# Patient Record
Sex: Female | Born: 1970 | Race: White | Hispanic: No | Marital: Married
Health system: Midwestern US, Academic
[De-identification: ages and names within clinical notes are randomized; demographics above are authoritative.]

---

## 2017-09-21 ENCOUNTER — Encounter: Admit: 2017-09-21 | Discharge: 2017-09-21 | Payer: BC Managed Care – PPO

## 2017-10-07 ENCOUNTER — Encounter: Admit: 2017-10-07 | Discharge: 2017-10-07 | Payer: BC Managed Care – PPO

## 2017-10-07 ENCOUNTER — Ambulatory Visit: Admit: 2017-10-07 | Discharge: 2017-10-08 | Payer: BC Managed Care – PPO

## 2017-10-07 DIAGNOSIS — G4733 Obstructive sleep apnea (adult) (pediatric): Principal | ICD-10-CM

## 2017-10-07 DIAGNOSIS — I471 Supraventricular tachycardia: ICD-10-CM

## 2017-10-07 DIAGNOSIS — Z0389 Encounter for observation for other suspected diseases and conditions ruled out: Principal | ICD-10-CM

## 2017-10-07 DIAGNOSIS — G909 Disorder of the autonomic nervous system, unspecified: ICD-10-CM

## 2017-10-07 DIAGNOSIS — I4891 Unspecified atrial fibrillation: ICD-10-CM

## 2017-10-07 DIAGNOSIS — I4892 Unspecified atrial flutter: ICD-10-CM

## 2017-10-07 MED ORDER — FLECAINIDE 50 MG PO TAB
ORAL_TABLET | Freq: Two times a day (BID) | ORAL | 11 refills | 30.00000 days | Status: AC
Start: 2017-10-07 — End: 2017-10-21

## 2017-10-21 ENCOUNTER — Encounter: Admit: 2017-10-21 | Discharge: 2017-10-21 | Payer: BC Managed Care – PPO

## 2017-10-21 MED ORDER — FLECAINIDE 50 MG PO TAB
50 mg | ORAL_TABLET | Freq: Two times a day (BID) | ORAL | 3 refills | 30.00000 days | Status: AC
Start: 2017-10-21 — End: 2017-11-04

## 2017-11-04 ENCOUNTER — Encounter: Admit: 2017-11-04 | Discharge: 2017-11-04 | Payer: BC Managed Care – PPO

## 2017-11-04 MED ORDER — FLECAINIDE 50 MG PO TAB
50 mg | ORAL_TABLET | Freq: Two times a day (BID) | ORAL | 3 refills | 30.00000 days | Status: AC
Start: 2017-11-04 — End: 2018-10-12

## 2018-03-25 LAB — COMPREHENSIVE METABOLIC PANEL
Lab: 13
Lab: 24
Lab: 27
Lab: 7.7
Lab: 80
Lab: 9.6
Lab: 91

## 2018-03-25 LAB — THYROID STIMULATING HORMONE-TSH: Lab: 1.8

## 2018-10-12 ENCOUNTER — Encounter: Admit: 2018-10-12 | Discharge: 2018-10-12 | Payer: BC Managed Care – PPO

## 2018-10-12 MED ORDER — FLECAINIDE 50 MG PO TAB
ORAL_TABLET | Freq: Two times a day (BID) | ORAL | 0 refills | 30.00000 days | Status: AC
Start: 2018-10-12 — End: 2018-11-25

## 2018-11-17 ENCOUNTER — Encounter: Admit: 2018-11-17 | Discharge: 2018-11-17 | Payer: BC Managed Care – PPO

## 2018-11-22 ENCOUNTER — Encounter: Admit: 2018-11-22 | Discharge: 2018-11-22 | Payer: BC Managed Care – PPO

## 2018-11-25 ENCOUNTER — Ambulatory Visit: Admit: 2018-11-25 | Discharge: 2018-11-26 | Payer: BC Managed Care – PPO

## 2018-11-25 ENCOUNTER — Encounter: Admit: 2018-11-25 | Discharge: 2018-11-25 | Payer: BC Managed Care – PPO

## 2018-11-25 DIAGNOSIS — I4891 Unspecified atrial fibrillation: ICD-10-CM

## 2018-11-25 DIAGNOSIS — Z0389 Encounter for observation for other suspected diseases and conditions ruled out: Principal | ICD-10-CM

## 2018-11-25 DIAGNOSIS — I471 Supraventricular tachycardia: ICD-10-CM

## 2018-11-25 DIAGNOSIS — I4892 Unspecified atrial flutter: ICD-10-CM

## 2018-11-25 MED ORDER — FERROUS SULFATE 325 MG (65 MG IRON) PO TBEC
325 mg | ORAL_TABLET | Freq: Every day | ORAL | 3 refills | Status: AC
Start: 2018-11-25 — End: ?

## 2018-11-25 MED ORDER — FLECAINIDE 50 MG PO TAB
50 mg | ORAL_TABLET | Freq: Two times a day (BID) | ORAL | 3 refills | 30.00000 days | Status: DC
Start: 2018-11-25 — End: 2019-11-02

## 2018-11-25 NOTE — Patient Instructions
1.  Start aspirin (enteric-coated) 81 mg/day  2.  Flecainide and iron refills sent  3.  We'll plan to see you back in about one year.

## 2018-11-26 DIAGNOSIS — I471 Supraventricular tachycardia: ICD-10-CM

## 2018-11-26 DIAGNOSIS — I4891 Unspecified atrial fibrillation: Principal | ICD-10-CM

## 2018-11-28 ENCOUNTER — Encounter: Admit: 2018-11-28 | Discharge: 2018-11-28 | Payer: BC Managed Care – PPO

## 2018-12-14 ENCOUNTER — Encounter: Admit: 2018-12-14 | Discharge: 2018-12-14 | Payer: BC Managed Care – PPO

## 2019-01-12 ENCOUNTER — Encounter: Admit: 2019-01-12 | Discharge: 2019-01-12

## 2019-01-12 DIAGNOSIS — I480 Paroxysmal atrial fibrillation: Secondary | ICD-10-CM

## 2019-01-12 DIAGNOSIS — E785 Hyperlipidemia, unspecified: Secondary | ICD-10-CM

## 2019-01-18 ENCOUNTER — Encounter: Admit: 2019-01-18 | Discharge: 2019-01-18

## 2019-01-18 DIAGNOSIS — E785 Hyperlipidemia, unspecified: Secondary | ICD-10-CM

## 2019-01-18 DIAGNOSIS — I480 Paroxysmal atrial fibrillation: Secondary | ICD-10-CM

## 2019-01-18 LAB — COMPREHENSIVE METABOLIC PANEL
Lab: 1
Lab: 141
Lab: 20
Lab: 23
Lab: 4
Lab: 63
Lab: 7.4

## 2019-01-18 LAB — LIPID PROFILE
Lab: 174
Lab: 111
Lab: 22
Lab: 3
Lab: 58

## 2019-03-21 ENCOUNTER — Encounter: Admit: 2019-03-21 | Discharge: 2019-03-21 | Payer: BC Managed Care – PPO

## 2019-03-21 NOTE — Progress Notes
Records request sent via fax to [place]. Payton Emerald, RN    Future Appointments   Date Time Provider [place]   04/24/2019  2:30 PM Terance Hart, MD [facility] OB/[facility]

## 2019-04-04 ENCOUNTER — Encounter: Admit: 2019-04-04 | Discharge: 2019-04-04

## 2019-04-04 NOTE — Progress Notes
Records received including appt notes, EMB results, [place], labs, and pap smear. Records placed in ready to scan file. Payton Emerald, RN    Future Appointments   Date Time Provider [place]   04/24/2019  2:30 PM Terance Hart, MD [facility] OB/GYN

## 2019-04-24 ENCOUNTER — Encounter: Admit: 2019-04-24 | Discharge: 2019-04-24 | Payer: BC Managed Care – PPO

## 2019-04-24 ENCOUNTER — Ambulatory Visit: Admit: 2019-04-24 | Discharge: 2019-04-24 | Payer: BC Managed Care – PPO

## 2019-04-24 NOTE — Patient Instructions
General Instructions  To Contact Me:   Please send a MyChart message to Ob/Gyn or call [phone] to reach your doctor?s nurse team.  To Have Medication Refilled:   Please use the MyChart Refill request or contact your pharmacy directly to request medication refills. Please allow 72 hours.  To Receive Your Test Results:   You will receive your test results in person at your appointment, or by MyChart if you are signed up. We prefer to discuss test results during your appointment time, so please try to have your labs done several days before your next routine visit. If unable to discuss in person, your results will either be sent to you via MyChart, by telephone, or by letter. If you are expecting results and have not heard from my office within 2 weeks of your testing, please send a MyChart message or call my office.  Our Lab (Location, Hours, and Fasting Information)  Lab Location: the 1st floor of the Medical Office Building, directly to the left of the Information Desk.  Lab Hours: Monday 6:30 am-6 pm, Tuesday-Friday 7 am-6 pm, and Saturday 7 am-noon.   Fasting for Lab: For fasting labs, please:   Do not eat for at least 8-10 hours before having your labs drawn, drink plenty of water, and make sure to continue your medications as prescribed (unless otherwise specified).  Radiology is on the 2nd floor of the Medical Office Building.  To Schedule an Appointment:   Contact our schedulers at [phone] and select 1. If it has been over a year since your last appointment, please ask for an annual or yearly physical appointment.   Receive Appointment Reminders on Your Cell Phone:   Make sure we have your cell phone number, and text [place] to [phone].  For Urgent Questions:   On nights, weekends or holidays, call the operator at [phone], and ask for the Ob/Gyn on call.   For emergencies, call 911.    We value your feedback and you may receive a survey about your experience with our office. If you had a favorable experience, the only positive survey results that we will receive are if we are rated in the 9 or 10 range out of 10 points.

## 2019-04-25 LAB — ESTRADIOL (E2): Lab: <15 pg/mL

## 2019-04-25 LAB — FOLLICLE STIMULATING HORMONE: Lab: 100 mU/mL

## 2019-04-25 LAB — TSH WITH FREE T4 REFLEX: Lab: 1.3 uU/mL (ref 0.35–5.00)

## 2019-04-25 LAB — BETA-HCG: Lab: 6 U/L — ABNORMAL HIGH (ref <5)

## 2019-05-12 ENCOUNTER — Encounter: Admit: 2019-05-12 | Discharge: 2019-05-12 | Payer: BC Managed Care – PPO

## 2019-11-01 ENCOUNTER — Encounter: Admit: 2019-11-01 | Discharge: 2019-11-01 | Payer: BC Managed Care – PPO

## 2019-11-01 MED ORDER — FLECAINIDE 50 MG PO TAB
ORAL_TABLET | Freq: Two times a day (BID) | ORAL | 3 refills | 30.00000 days | Status: DC
Start: 2019-11-01 — End: 2019-11-20

## 2019-11-20 MED ORDER — FLECAINIDE 50 MG PO TAB
50 mg | ORAL_TABLET | Freq: Two times a day (BID) | ORAL | 3 refills | 30.00000 days | Status: AC
Start: 2019-11-20 — End: ?

## 2019-11-20 NOTE — Progress Notes
Date of Service: 11/20/2019    Isabel Cervantes is a 49 y.o. female.       HPI     Isabel Cervantes was in the [facility] clinic today for follow-up regarding her history of supraventricular arrhythmia and atrial fibrillation.  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  Of course the pandemic has totally messed up those plans and she is still in the process of trying to get things set up.  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    She is under a lot of stress right now because her 60 year old daughter was recently diagnosed with colon cancer and is probably looking at the need for a surgical procedure.    From a cardiac standpoint [name] seems to be doing well.  She is not having any trouble with palpitations nor is she having any breathlessness or anginal chest discomfort.  She had an illness a couple of months ago that was characterized by some right-sided pleurisy.  She had a couple of EKGs done at the [place] clinic and there was some question about one of them being mildly abnormal but all of her symptoms resolved spontaneously and she has had no recurrence.         Vitals:    11/20/19 1119   BP: 122/82   BP Source: Arm, Left Upper   Patient Position: Sitting   Pulse: 81   SpO2: 98%   Weight: 98.1 kg (216 lb 4.8 oz)   Height: 1.626 m (5' 4)   PainSc: Zero     Body mass index is 37.13 kg/m?Marland Kitchen     Past Medical History  Patient Active Problem List    Diagnosis Date Noted   ? OSA (obstructive sleep apnea) 09/17/2015     2017 - Mildly abnormal sleep study--didn't quite qualify for device.  Considering repeat study.  2018 - CPAP started with excellent clinical benefit     ? Dyspnea 03/03/2011   ? Autonomic dysfunction 05/14/2010   ? Chest pain at rest 08/29/2009   ? Light-headedness 01/17/2009   ? Observation for suspected cardiovascular disease 01/16/2009     Cardiac Surveillance            1998 - Structurally normal heart by stress echo.            12/01 - ECOD: negative for ischemia, normal LV size and function.  PAP 36.      ? Atrial fibrillation (HCC) 01/16/2009     Atrial Fibrillation             2001 - Hospitalized at [facility]. Diltiazem initiated. Flecainide as outpatient since then at 50 mg PO BID.  09/03/16 Monitor: Normal 30-day event monitor.  The patient's symptoms do not correlate with significant dysrhythmia.     ? SVT (supraventricular tachycardia) (HCC) 01/16/2009     HIstory of SVT with RFA--she has infrequent recurrences of limited duration (<1 minute)  Atrial tachycardia and flutter             1998 - Radiofrequency ablation by LDB             8/98 - Right atrial/crista tachycardia identified and ablated with significant improvement                       in palpitations.  8/98 - Moderate generalized atrial abnormalities. On Isuprel, she had nonsustained,                       atypical atrial flutter.        ? 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         Review of Systems   Constitution: Negative.   HENT: Negative.    Eyes: Negative.    Cardiovascular: Positive for palpitations.   Respiratory: Negative.    Endocrine: Negative.    Hematologic/Lymphatic: Negative.    Skin: Negative.    Gastrointestinal: Negative.    Genitourinary: Negative.    Neurological: Negative.    Psychiatric/Behavioral: Negative.    Allergic/Immunologic: Negative.        Physical Exam    Physical Exam   General Appearance: no distress   Skin: warm, no ulcers or xanthomas   Digits and Nails: no cyanosis or clubbing   Eyes: conjunctivae and lids normal, pupils are equal and round   Teeth/Gums/Palate: dentition unremarkable, no lesions   Lips & Oral Mucosa: no pallor or cyanosis   Neck Veins: normal JVP , neck veins are not distended   Thyroid: no nodules, masses, tenderness or enlargement   Chest Inspection: chest is normal in appearance   Respiratory Effort: breathing comfortably, no respiratory distress Auscultation/Percussion: lungs clear to auscultation, no rales or rhonchi, no wheezing   PMI: PMI not enlarged or displaced   Cardiac Rhythm: regular rhythm and normal rate   Cardiac Auscultation: S1, S2 normal, no rub, no gallop   Murmurs: no murmur   Peripheral Circulation: normal peripheral circulation   Carotid Arteries: normal carotid upstroke bilaterally, no bruits   Radial Arteries: normal symmetric radial pulses   Abdominal Aorta: no abdominal aortic bruit   Pedal Pulses: normal symmetric pedal pulses   Lower Extremity Edema: no lower extremity edema   Abdominal Exam: soft, non-tender, no masses, bowel sounds normal   Liver & Spleen: no organomegaly   Gait & Station: walks without assistance   Muscle Strength: normal muscle tone   Orientation: oriented to time, place and person   Affect & Mood: appropriate and sustained affect   Language and Memory: patient responsive and seems to comprehend information   Neurologic Exam: neurological assessment grossly intact   Other: moves all extremities      Problems Addressed Today  Encounter Diagnoses   Name Primary?   ? SVT (supraventricular tachycardia) (HCC)    ? Autonomic dysfunction    ? Observation for suspected cardiovascular disease    ? OSA (obstructive sleep apnea)        Assessment and Plan       SVT (supraventricular tachycardia) (HCC)  I renewed her prescription for flecainide today.  It is doing a nice job of suppressing her atrial arrhythmias.    Autonomic dysfunction  She has had no problems with lightheadedness or syncope lately.    Observation for suspected cardiovascular disease  She needs to schedule a follow-up appointment with Dr. Alona Bene.  I did not order any routine lab work today.    OSA (obstructive sleep apnea)  She had a question about the Inspra device.  Additionally, she is having a lot of dry mouth on her CPAP machine.  I asked her to give Dr. Christena Deem office a call.      Current Medications (including today's revisions)  ? cetirizine (ZYRTEC) 10 mg tablet Take 5 mg by mouth every morning.   ? ferrous sulfate 325 mg (  65 mg iron) tablet Take one tablet by mouth daily. (Patient taking differently: Take 325 mg by mouth as Needed.)   ? flecainide (TAMBOCOR) 50 mg tablet Take one tablet by mouth twice daily.   ? L.acid/L.casei/B.bif/B.lon/FOS (PROBIOTIC BLEND PO) Take 1 tablet by mouth daily.   ? MULTI-VITAMIN PO Take  by mouth daily.     Total time spent on today's office visit was 30 minutes.  This includes face-to-face in person visit with patient as well as nonface-to-face time including review of the EMR, outside records, labs, radiologic studies, echocardiogram & other cardiovascular studies, formation of treatment plan, after visit summary, future disposition, and lastly on documentation.

## 2019-11-20 NOTE — Assessment & Plan Note
I renewed her prescription for flecainide today.  It is doing a nice job of suppressing her atrial arrhythmias.

## 2019-11-20 NOTE — Assessment & Plan Note
She had a question about the Inspra device.  Additionally, she is having a lot of dry mouth on her CPAP machine.  I asked her to give Dr. Christena Deem office a call.

## 2019-11-20 NOTE — Telephone Encounter
Dr. Barry Dienes saw patient for OV today and note states, She had a couple of EKGs done at the [facility] clinic and there was some question about one of them being mildly abnormal but all of her symptoms resolved spontaneously and she has had no recurrence. Dr. Barry Dienes reviewed the EKG received after the patient left clinic and stated they looked good and there was not a conduction issue. I called the patient and LM letting the patient know this. Provided call back number.

## 2019-11-20 NOTE — Assessment & Plan Note
She needs to schedule a follow-up appointment with Dr. Alona Bene.  I did not order any routine lab work today.

## 2019-11-20 NOTE — Assessment & Plan Note
She has had no problems with lightheadedness or syncope lately.

## 2020-05-23 ENCOUNTER — Encounter: Admit: 2020-05-23 | Discharge: 2020-05-23 | Payer: BC Managed Care – PPO

## 2020-05-23 NOTE — Telephone Encounter
Routed to Dr. Barry Dienes.    Non-Urgent Medical Question    You  Mckinnley, [place] Just now (2:05 PM)   MH  I will run this past Dr. Barry Dienes and let you know his thoughts. Thanks!  ?  Les Longmore, RN    This MyChart message has not been read.   Isabel Cervantes  Patient Medical Advice Request Pool 9 minutes ago (1:56 PM)   RG  Hi Isabel Cervantes,  ?  I'm just wanting to avoid something that will trigger my tachycardia or atrial fib.  I'm having it done at the [facility] in [place], [place].     You  Isabel Cervantes 2 hours ago (11:20 AM)   MH  I will run this past Dr. Barry Dienes. We don't typically get asked about the medications they should avoid. Did they have any particular concerns?  ?  Where are you having this done at?  ?  Thanks!  ?  Margarite Vessel, RN     You routed conversation to You 2 hours ago (11:08 AM)   Vaughan Browner, RN routed conversation to [employer] Nurse [employer] 3 hours ago (11:01 AM)   Isabel Cervantes  Patient Medical Advice Request Pool 3 hours ago (10:57 AM)   RG  Hello,  ?  I will be having a breast biopsy next week, they will be numbing me. I was told to check with Dr Danella Maiers' office to see which medications they should avoid.  Their first choice is Novocaine. Please advise.  ?  Thank you!

## 2020-05-24 ENCOUNTER — Encounter: Admit: 2020-05-24 | Discharge: 2020-05-24 | Payer: BC Managed Care – PPO

## 2021-01-01 ENCOUNTER — Encounter: Admit: 2021-01-01 | Discharge: 2021-01-01 | Payer: BC Managed Care – PPO

## 2021-01-01 MED ORDER — FLECAINIDE 50 MG PO TAB
ORAL_TABLET | Freq: Two times a day (BID) | 3 refills
Start: 2021-01-01 — End: ?

## 2021-01-07 ENCOUNTER — Encounter: Admit: 2021-01-07 | Discharge: 2021-01-07 | Payer: BC Managed Care – PPO

## 2021-01-07 NOTE — Progress Notes
Please fax records of most recent labs, including lipids, to [phone]. Patient is a mutual patient of Dr. Steve Owens with [facility] and has an upcoming appointment. Thank you!

## 2021-01-21 ENCOUNTER — Encounter: Admit: 2021-01-21 | Discharge: 2021-01-21 | Payer: BC Managed Care – PPO

## 2021-01-21 ENCOUNTER — Ambulatory Visit: Admit: 2021-01-21 | Discharge: 2021-01-21 | Payer: BC Managed Care – PPO

## 2021-01-21 DIAGNOSIS — I471 Supraventricular tachycardia: Secondary | ICD-10-CM

## 2021-01-21 DIAGNOSIS — I4892 Unspecified atrial flutter: Secondary | ICD-10-CM

## 2021-01-21 DIAGNOSIS — D649 Anemia, unspecified: Secondary | ICD-10-CM

## 2021-01-21 DIAGNOSIS — I4891 Unspecified atrial fibrillation: Secondary | ICD-10-CM

## 2021-01-21 DIAGNOSIS — G909 Disorder of the autonomic nervous system, unspecified: Secondary | ICD-10-CM

## 2021-01-21 DIAGNOSIS — I480 Paroxysmal atrial fibrillation: Secondary | ICD-10-CM

## 2021-01-21 DIAGNOSIS — R079 Chest pain, unspecified: Secondary | ICD-10-CM

## 2021-01-21 DIAGNOSIS — Z0389 Encounter for observation for other suspected diseases and conditions ruled out: Secondary | ICD-10-CM

## 2021-01-21 DIAGNOSIS — R000 Tachycardia, unspecified: Secondary | ICD-10-CM

## 2021-01-21 MED ORDER — FLECAINIDE 50 MG PO TAB
50 mg | ORAL_TABLET | Freq: Two times a day (BID) | ORAL | 3 refills | 30.00000 days | Status: AC
Start: 2021-01-21 — End: ?

## 2021-01-21 NOTE — Assessment & Plan Note
We attached a 5-day Holter monitor today to try to determine whether some of her palpitation symptoms might be related to atrial fibrillation.

## 2021-01-21 NOTE — Assessment & Plan Note
I have ordered a stress echo Doppler to evaluate her chest discomfort symptoms.  The fact that she is on flecainide means that we need to be careful about intermittent screening for the development of significant coronary disease and possible cardiac ischemia.

## 2021-01-21 NOTE — Progress Notes
Date of Service: 01/21/2021    Isabel Cervantes is a 50 y.o. female.       HPI     Isabel Cervantes was in the [facility] clinic today.  She had called in late March for an appointment because of symptoms and was told that this was my soonest appointment, over 2 months later.  I asked her to call and speak to one of my nurses if this happens in the future because our scheduling system does not really accommodate existing patients with new or urgent problems.    Isabel Cervantes had COVID back in late December.  She initially thought that she had recovered and she started using a new rowing machine that her husband had given her in early March, but after only a few weeks on the machine she just felt like she could not do it anymore 1 day.  She has not really tried using the rowing machine since then.  The symptoms are pretty nonspecific but she describes issues with palpitation and fatigue.  She is more breathless with exertion than she remembers from a year ago.    She says that when she rolls over suddenly in bed she feels like there is something happening in her chest.  She still has an abnormal sense of taste and smell after the COVID infection.         Vitals:    01/21/21 1035   BP: 110/60   Pulse: 71   SpO2: 98%   PainSc: Zero   Weight: 96.2 kg (212 lb)   Height: 167.6 cm (5' 6)     Body mass index is 34.22 kg/m?Isabel Cervantes     Past Medical History  Patient Active Problem List    Diagnosis Date Noted   ? OSA (obstructive sleep apnea) 09/17/2015     2017 - Mildly abnormal sleep study--didn't quite qualify for device.  Considering repeat study.  2018 - CPAP started with excellent clinical benefit     ? Dyspnea 03/03/2011   ? Autonomic dysfunction 05/14/2010   ? Chest pain at rest 08/29/2009   ? Light-headedness 01/17/2009   ? Observation for suspected cardiovascular disease 01/16/2009     Cardiac Surveillance            1998 - Structurally normal heart by stress echo.            12/01 - ECOD: negative for ischemia, normal LV size and function.  PAP 36.      ? Atrial fibrillation (HCC) 01/16/2009     Atrial Fibrillation             2001 - Hospitalized at [facility]. Diltiazem initiated. Flecainide as outpatient since then at 50 mg PO BID.  09/03/16 Monitor: Normal 30-day event monitor.  The patient's symptoms do not correlate with significant dysrhythmia.     ? SVT (supraventricular tachycardia) (HCC) 01/16/2009     HIstory of SVT with RFA--she has infrequent recurrences of limited duration (<1 minute)  Atrial tachycardia and flutter             1998 - Radiofrequency ablation by LDB             8/98 - Right atrial/crista tachycardia identified and ablated with significant improvement                       in palpitations.              8/98 - Moderate generalized atrial abnormalities. On  Isuprel, she had nonsustained,                       atypical atrial flutter.        ? 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   HENT: Negative.    Eyes: Negative.    Cardiovascular: Positive for dyspnea on exertion and irregular heartbeat.   Respiratory: Negative.    Endocrine: Negative.    Hematologic/Lymphatic: Negative.    Skin: Negative.    Musculoskeletal: Negative.    Gastrointestinal: Negative.    Genitourinary: Negative.    Neurological: Negative.    Psychiatric/Behavioral: Negative.    Allergic/Immunologic: Negative.        Physical Exam    Physical Exam   General Appearance: no distress   Skin: warm, no ulcers or xanthomas   Digits and Nails: no cyanosis or clubbing   Eyes: conjunctivae and lids normal, pupils are equal and round   Teeth/Gums/Palate: dentition unremarkable, no lesions   Lips & Oral Mucosa: no pallor or cyanosis   Neck Veins: normal JVP , neck veins are not distended   Thyroid: no nodules, masses, tenderness or enlargement   Chest Inspection: chest is normal in appearance   Respiratory Effort: breathing comfortably, no respiratory distress   Auscultation/Percussion: lungs clear to auscultation, no rales or rhonchi, no wheezing   PMI: PMI not enlarged or displaced   Cardiac Rhythm: regular rhythm and normal rate   Cardiac Auscultation: S1, S2 normal, no rub, no gallop   Murmurs: no murmur   Peripheral Circulation: normal peripheral circulation   Carotid Arteries: normal carotid upstroke bilaterally, no bruits   Radial Arteries: normal symmetric radial pulses   Abdominal Aorta: no abdominal aortic bruit   Pedal Pulses: normal symmetric pedal pulses   Lower Extremity Edema: no lower extremity edema   Abdominal Exam: soft, non-tender, no masses, bowel sounds normal   Liver & Spleen: no organomegaly   Gait & Station: walks without assistance   Muscle Strength: normal muscle tone   Orientation: oriented to time, place and person   Affect & Mood: appropriate and sustained affect   Language and Memory: patient responsive and seems to comprehend information   Neurologic Exam: neurological assessment grossly intact   Other: moves all extremities      Cardiovascular Health Factors  Vitals BP Readings from Last 3 Encounters:   01/21/21 110/60   11/20/19 122/82   04/24/19 123/72     Wt Readings from Last 3 Encounters:   01/21/21 96.2 kg (212 lb)   11/20/19 98.1 kg (216 lb 4.8 oz)   04/24/19 95.8 kg (211 lb 4.8 oz)     BMI Readings from Last 3 Encounters:   01/21/21 34.22 kg/m?   11/20/19 37.13 kg/m?   04/24/19 34.10 kg/m?      Smoking Social History     Tobacco Use   Smoking Status Never Smoker   Smokeless Tobacco Never Used      Lipid Profile Cholesterol   Date Value Ref Range Status   01/18/2019 174  Final     HDL   Date Value Ref Range Status   01/18/2019 58  Final     LDL   Date Value Ref Range Status   01/18/2019 94  Final     Triglycerides   Date Value Ref Range Status   01/18/2019 111  Final      Blood Sugar No  results found for: HGBA1C  Glucose   Date Value Ref Range Status   01/18/2019 99  Final   03/25/2018 91  Final   06/18/2015 98  Final          Problems Addressed Today  Encounter Diagnoses Name Primary?   ? SVT (supraventricular tachycardia) (HCC) Yes   ? Paroxysmal atrial fibrillation (HCC)    ? Autonomic dysfunction    ? Chest pain at rest        Assessment and Plan       SVT (supraventricular tachycardia) (HCC)  We attached a 5-day Holter monitor today to evaluate her various palpitation symptoms.  I did not recommend any changes in her medications.  She has done quite well on flecainide and I sent in a new prescription today.    Atrial fibrillation (HCC)  We attached a 5-day Holter monitor today to try to determine whether some of her palpitation symptoms might be related to atrial fibrillation.    Autonomic dysfunction  I suspect that some of her symptoms are related to an exacerbation of her pre-existing autonomic dysfunction caused by her COVID infection.    Chest pain at rest  I have ordered a stress echo Doppler to evaluate her chest discomfort symptoms.  The fact that she is on flecainide means that we need to be careful about intermittent screening for the development of significant coronary disease and possible cardiac ischemia.      Current Medications (including today's revisions)  ? cetirizine (ZYRTEC) 10 mg tablet Take 5 mg by mouth every morning.   ? ferrous sulfate 325 mg (65 mg iron) tablet Take one tablet by mouth daily. (Patient taking differently: Take 325 mg by mouth as Needed.)   ? flecainide (TAMBOCOR) 50 mg tablet Take one tablet by mouth twice daily.   ? L.acid/L.casei/B.bif/B.lon/FOS (PROBIOTIC BLEND PO) Take 1 tablet by mouth daily.   ? MULTI-VITAMIN PO Take  by mouth daily.     Total time spent on today's office visit was 30 minutes.  This includes face-to-face in person visit with patient as well as nonface-to-face time including review of the EMR, outside records, labs, radiologic studies, echocardiogram & other cardiovascular studies, formation of treatment plan, after visit summary, future disposition, and lastly on documentation.

## 2021-01-21 NOTE — Assessment & Plan Note
I suspect that some of her symptoms are related to an exacerbation of her pre-existing autonomic dysfunction caused by her COVID infection.

## 2021-01-21 NOTE — Progress Notes
Holter Placement Record  Ordering Physician: Jonelle Sports.   Diagnosis: SVT  Length: 5 days  Location where Holter was placed: home  Will Holter be returned by mail? Yes

## 2021-01-29 ENCOUNTER — Encounter: Admit: 2021-01-29 | Discharge: 2021-01-29 | Payer: BC Managed Care – PPO

## 2021-01-29 NOTE — Telephone Encounter
Pt wasn't sure if her holter monitor was working because she didn't see a light flashing. I explained that the green light only flashed periodically and that it was normal if there was no color.

## 2021-02-20 ENCOUNTER — Encounter: Admit: 2021-02-20 | Discharge: 2021-02-20 | Payer: BC Managed Care – PPO

## 2021-02-20 NOTE — Telephone Encounter
Left voicemail with results and recommendations. Left callback for any questions, comments, or concerns.

## 2021-02-20 NOTE — Telephone Encounter
-----   Message from Vanice Sarah, MD sent at 02/20/2021  1:51 PM CDT -----  [facility], can you please let Taelynn know that her monitor looks good?  Some very brief SVT that wasn't symptomatic.  Thanks.    Cc:  Dr. Alona Bene

## 2021-03-28 ENCOUNTER — Encounter: Admit: 2021-03-28 | Discharge: 2021-03-28 | Payer: BC Managed Care – PPO

## 2021-03-28 ENCOUNTER — Ambulatory Visit: Admit: 2021-03-28 | Discharge: 2021-03-28 | Payer: BC Managed Care – PPO

## 2021-03-28 DIAGNOSIS — I471 Supraventricular tachycardia: Secondary | ICD-10-CM

## 2021-03-28 MED ORDER — PERFLUTREN LIPID MICROSPHERES 1.1 MG/ML IV SUSP
1-10 mL | Freq: Once | INTRAVENOUS | 0 refills | Status: CP | PRN
Start: 2021-03-28 — End: ?
  Administered 2021-03-28: 20:00:00 1 mL via INTRAVENOUS

## 2021-03-31 ENCOUNTER — Encounter: Admit: 2021-03-31 | Discharge: 2021-03-31 | Payer: BC Managed Care – PPO

## 2021-03-31 NOTE — Telephone Encounter
03/31/2021 2:10 PM   Reviewed test results with patient. She did not have any questions or concerns.       Michiel Cowboy, MD  [facility]  Cc: Mamie Nick [facility] Nurse [facility], Sejal's stress echo looks fine. Thanks.

## 2022-03-04 ENCOUNTER — Encounter: Admit: 2022-03-04 | Discharge: 2022-03-04 | Payer: BC Managed Care – PPO

## 2022-03-04 MED ORDER — FLECAINIDE 50 MG PO TAB
ORAL_TABLET | 3 refills
Start: 2022-03-04 — End: ?

## 2022-03-05 ENCOUNTER — Encounter: Admit: 2022-03-05 | Discharge: 2022-03-05 | Payer: BC Managed Care – PPO

## 2022-06-02 ENCOUNTER — Encounter: Admit: 2022-06-02 | Discharge: 2022-06-02 | Payer: BC Managed Care – PPO

## 2022-06-02 MED ORDER — FLECAINIDE 50 MG PO TAB
ORAL_TABLET | 3 refills
Start: 2022-06-02 — End: ?

## 2022-06-03 ENCOUNTER — Encounter: Admit: 2022-06-03 | Discharge: 2022-06-03 | Payer: BC Managed Care – PPO

## 2022-08-23 ENCOUNTER — Encounter: Admit: 2022-08-23 | Discharge: 2022-08-23 | Payer: BC Managed Care – PPO

## 2022-08-24 ENCOUNTER — Encounter: Admit: 2022-08-24 | Discharge: 2022-08-24 | Payer: BC Managed Care – PPO

## 2022-08-24 NOTE — Telephone Encounter
-----  Message from Michiel Cowboy, MD sent at 08/24/2022  1:57 PM CST -----  Regarding: RE: ER visit  Can we get the EKG?  We should probably increase her flecainide to 75 mg BID if she's willing.  No testing.  Yes, I'll see her 2/6.  ----- Message -----  From: Asencion Noble  Sent: 08/23/2022   2:37 AM CST  To: Michiel Cowboy, MD  Subject: ER visit                                         Pt seen in ER this morning reports episode of rapid heart beat in the 180's chest discomfort, diaphoresis awoke with.  Would you like to seen Feb 6th any testing before?    Thanks  Richardson Landry

## 2022-09-07 ENCOUNTER — Encounter: Admit: 2022-09-07 | Discharge: 2022-09-07 | Payer: BC Managed Care – PPO

## 2022-09-08 ENCOUNTER — Encounter: Admit: 2022-09-08 | Discharge: 2022-09-08 | Payer: BC Managed Care – PPO

## 2022-09-08 DIAGNOSIS — I471 PSVT (paroxysmal supraventricular tachycardia): Secondary | ICD-10-CM

## 2022-09-08 DIAGNOSIS — I4892 Unspecified atrial flutter: Secondary | ICD-10-CM

## 2022-09-08 DIAGNOSIS — R000 Tachycardia, unspecified: Secondary | ICD-10-CM

## 2022-09-08 DIAGNOSIS — R0989 Other specified symptoms and signs involving the circulatory and respiratory systems: Secondary | ICD-10-CM

## 2022-09-08 DIAGNOSIS — I4891 Unspecified atrial fibrillation: Secondary | ICD-10-CM

## 2022-09-08 DIAGNOSIS — I480 Paroxysmal atrial fibrillation: Secondary | ICD-10-CM

## 2022-09-08 DIAGNOSIS — D649 Anemia, unspecified: Secondary | ICD-10-CM

## 2022-09-08 DIAGNOSIS — Z5181 Encounter for therapeutic drug level monitoring: Secondary | ICD-10-CM

## 2022-09-08 DIAGNOSIS — Z0389 Encounter for observation for other suspected diseases and conditions ruled out: Secondary | ICD-10-CM

## 2022-09-08 NOTE — Assessment & Plan Note
The episode a few weeks ago sounds as if it might have been atrial fibrillation.  I do not think we need to do anything different with her medications at this point.  I talked about the possibility of adding metoprolol to her flecainide, partly due to the property of the flecainide that might have enhanced AV node conduction during the atrial fibrillation.  She recalls an adverse effect of atenolol many years ago and is a little bit skittish about starting another beta-blocker so we did not do this today.    I did suggest that she purchase a Kardia monitor so that we could get some idea about the exact nature of her rhythm should she have further palpitation symptoms.    She has been on very low-dose flecainide for nearly 20 years.  I suggested that we should check a serum flecainide level because we may want to increase the dose somewhat.

## 2022-09-08 NOTE — Patient Instructions
Kardia monitor through [employer] should cost ~$79.  Flecainide level

## 2022-09-08 NOTE — Progress Notes
Date of Service: 09/08/2022    Isabel Cervantes is a 52 y.o. female.       HPI     Isabel Cervantes was in the [place] clinic today for a post-ED follow-up visit.    She had been awakened early in the morning on January 21 with a sense of rapid, irregular palpitations.  She came promptly to the emergency department but apparently converted back to sinus rhythm just before the EKG was done.  We do not have any tracing of her actual rhythm during the episode.    She had a follow-up episode for about 30 seconds the next day but after that she has had no recurrent palpitation symptoms.    No changes have been made in her medication program.  The only thing that she considers to have been different is the fact that she had been using topical minoxidil for several days prior to the episode.  She has been using it on her scalp but she discontinued the use of the medication after the episode of palpitations.    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  s         Vitals:    09/08/22 1016   BP: 110/70   BP Source: Arm, Left Upper   Pulse: 83   SpO2: 97%   O2 Device: None (Room air)   PainSc: Zero   Weight: 104.1 kg (229 lb 6.4 oz)   Height: 167.6 cm (5' 6)     Body mass index is 37.03 kg/m?Marland Kitchen     Past Medical History  Patient Active Problem List    Diagnosis Date Noted    OSA (obstructive sleep apnea) 09/17/2015     2017 - Mildly abnormal sleep study--didn't quite qualify for device.  Considering repeat study.  2018 - CPAP started with excellent clinical benefit      Dyspnea 03/03/2011    Autonomic dysfunction 05/14/2010    Chest pain at rest 08/29/2009    Light-headedness 01/17/2009    Observation for suspected cardiovascular disease 01/16/2009     Cardiac Surveillance            1998 - Structurally normal heart by stress echo.            12/01 - ECOD: negative for ischemia, normal LV size and function.  PAP 36.       Atrial fibrillation (HCC) 01/16/2009     Atrial Fibrillation             2001 - Hospitalized at [facility]. Diltiazem initiated. Flecainide as outpatient since then at 50 mg PO BID.  09/03/16 Monitor: Normal 30-day event monitor.  The patient's symptoms do not correlate with significant dysrhythmia.      SVT (supraventricular tachycardia) 01/16/2009     HIstory of SVT with RFA--she has infrequent recurrences of limited duration (<1 minute)  Atrial tachycardia and flutter             1998 - Radiofrequency ablation by LDB             8/98 - Right atrial/crista tachycardia identified and ablated with significant improvement                       in palpitations.              8/98 - Moderate generalized atrial abnormalities. On Isuprel, she  had nonsustained,                       atypical atrial flutter.         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   HENT: Negative.     Eyes: Negative.    Cardiovascular: Negative.    Respiratory: Negative.     Endocrine: Negative.    Hematologic/Lymphatic: Negative.    Skin: Negative.    Musculoskeletal: Negative.    Gastrointestinal: Negative.    Genitourinary: Negative.    Neurological: Negative.    Psychiatric/Behavioral: Negative.     Allergic/Immunologic: Negative.        Physical Exam    Physical Exam   General Appearance: no distress   Skin: warm, no ulcers or xanthomas   Digits and Nails: no cyanosis or clubbing   Eyes: conjunctivae and lids normal, pupils are equal and round   Teeth/Gums/Palate: dentition unremarkable, no lesions   Lips & Oral Mucosa: no pallor or cyanosis   Neck Veins: normal JVP , neck veins are not distended   Thyroid: no nodules, masses, tenderness or enlargement   Chest Inspection: chest is normal in appearance   Respiratory Effort: breathing comfortably, no respiratory distress   Auscultation/Percussion: lungs clear to auscultation, no rales or rhonchi, no wheezing   PMI: PMI not enlarged or displaced   Cardiac Rhythm: regular rhythm and normal rate   Cardiac Auscultation: S1, S2 normal, no rub, no gallop   Murmurs: no murmur   Peripheral Circulation: normal peripheral circulation   Carotid Arteries: normal carotid upstroke bilaterally, no bruits   Radial Arteries: normal symmetric radial pulses   Abdominal Aorta: no abdominal aortic bruit   Pedal Pulses: normal symmetric pedal pulses   Lower Extremity Edema: no lower extremity edema   Abdominal Exam: soft, non-tender, no masses, bowel sounds normal   Liver & Spleen: no organomegaly   Gait & Station: walks without assistance   Muscle Strength: normal muscle tone   Orientation: oriented to time, place and person   Affect & Mood: appropriate and sustained affect   Language and Memory: patient responsive and seems to comprehend information   Neurologic Exam: neurological assessment grossly intact   Other: moves all extremities      Cardiovascular Studies    EKG:  SR, rate 73.  IRBBB    Cardiovascular Health Factors  Vitals BP Readings from Last 3 Encounters:   09/08/22 110/70   03/28/21 110/74   01/21/21 110/60     Wt Readings from Last 3 Encounters:   09/08/22 104.1 kg (229 lb 6.4 oz)   03/28/21 96.2 kg (212 lb)   01/21/21 96.2 kg (212 lb)     BMI Readings from Last 3 Encounters:   09/08/22 37.03 kg/m?   03/28/21 34.22 kg/m?   01/21/21 34.22 kg/m?      Smoking Social History     Tobacco Use   Smoking Status Never   Smokeless Tobacco Never      Lipid Profile Cholesterol   Date Value Ref Range Status   01/18/2019 174  Final     HDL   Date Value Ref Range Status   01/18/2019 58  Final     LDL   Date Value Ref Range Status   01/18/2019 94  Final     Triglycerides   Date Value Ref Range Status   01/18/2019 111  Final  Blood Sugar No results found for: HGBA1C  Glucose   Date Value Ref Range Status   08/23/2022 98  Final   01/18/2019 99  Final   03/25/2018 91  Final          Problems Addressed Today  Encounter Diagnoses   Name Primary?    Cardiovascular symptoms Yes    PSVT (paroxysmal supraventricular tachycardia)     Observation for suspected cardiovascular disease     Encounter for therapeutic drug level monitoring        Assessment and Plan              Current Medications (including today's revisions)   cetirizine (ZYRTEC) 10 mg tablet Take one-half tablet by mouth every morning.    ELDERBERRY FRUIT PO     ferrous sulfate 325 mg (65 mg iron) tablet Take one tablet by mouth daily.    flecainide (TAMBOCOR) 50 mg tablet Take 1.5 tablets by mouth twice daily. (Patient taking differently: Take one tablet by mouth daily.)    L.acid/L.casei/B.bif/B.lon/FOS (PROBIOTIC BLEND PO) Take 1 tablet by mouth daily.    MULTI-VITAMIN PO Take  by mouth daily.     Total time spent on today's office visit was 45 minutes.  This includes face-to-face in person visit with patient as well as nonface-to-face time including review of the EMR, outside records, labs, radiologic studies, echocardiogram & other cardiovascular studies, formation of treatment plan, after visit summary, future disposition, and lastly on documentation.

## 2022-09-24 ENCOUNTER — Encounter: Admit: 2022-09-24 | Discharge: 2022-09-24 | Payer: BC Managed Care – PPO

## 2022-09-24 DIAGNOSIS — Z0389 Encounter for observation for other suspected diseases and conditions ruled out: Secondary | ICD-10-CM

## 2022-09-24 DIAGNOSIS — Z5181 Encounter for therapeutic drug level monitoring: Secondary | ICD-10-CM

## 2022-09-24 DIAGNOSIS — I471 PSVT (paroxysmal supraventricular tachycardia): Secondary | ICD-10-CM

## 2022-09-24 DIAGNOSIS — R0989 Other specified symptoms and signs involving the circulatory and respiratory systems: Secondary | ICD-10-CM

## 2022-10-02 ENCOUNTER — Encounter: Admit: 2022-10-02 | Discharge: 2022-10-02 | Payer: BC Managed Care – PPO

## 2022-10-02 MED ORDER — FLECAINIDE 50 MG PO TAB
75 mg | ORAL_TABLET | Freq: Two times a day (BID) | ORAL | 3 refills | 30.00000 days | Status: AC
Start: 2022-10-02 — End: ?

## 2022-10-02 MED ORDER — FLECAINIDE 50 MG PO TAB
75 mg | ORAL_TABLET | Freq: Two times a day (BID) | ORAL | 3 refills | 30.00000 days | Status: DC
Start: 2022-10-02 — End: 2022-10-02

## 2022-10-02 NOTE — Telephone Encounter
-----   Message from Michiel Cowboy, MD sent at 10/01/2022  2:00 PM CST -----  Do you mind clarifying whether she was on the 75 mg BID dosing for at least 5 days before the blood work was drawn.  If that is the case, then yes I think we probably should increase to 100 mg BID.  Thank you.  ----- Message -----  From: Katina Degree, RN  Sent: 09/30/2022   1:17 PM CST  To: Michiel Cowboy, MD    She is currently on 75 mg BID, do you want to increase to 100 mg BID  ----- Message -----  From: Michiel Cowboy, MD  Sent: 09/29/2022   5:04 PM CST  To: Katina Degree, RN    So I think she's taking 50 mg BID--she could potentially increase to 75 mg BID.  ----- Message -----  From: Katina Degree, RN  Sent: 09/24/2022  10:42 AM CST  To: Michiel Cowboy, MD    Lab results for your review and recommendations.

## 2022-10-02 NOTE — Telephone Encounter
Discussed lab results and recommendations with patient. Patient reports she is currently still taking 50 mg flecainide twice daily. Instructed patient to start taking 75 mg flecainide twice daily. Patient verbalizes understanding and does not have any further questions or concerns at this time. New prescription sent to [facility] at request of patient.

## 2023-03-31 ENCOUNTER — Encounter: Admit: 2023-03-31 | Discharge: 2023-03-31 | Payer: BC Managed Care – PPO

## 2023-09-08 ENCOUNTER — Encounter: Admit: 2023-09-08 | Discharge: 2023-09-08 | Payer: BC Managed Care – PPO

## 2023-09-13 ENCOUNTER — Encounter: Admit: 2023-09-13 | Discharge: 2023-09-13 | Payer: BC Managed Care – PPO

## 2023-09-14 ENCOUNTER — Encounter: Admit: 2023-09-14 | Discharge: 2023-09-14 | Payer: BC Managed Care – PPO

## 2023-09-14 ENCOUNTER — Ambulatory Visit: Admit: 2023-09-14 | Discharge: 2023-09-15 | Payer: BC Managed Care – PPO

## 2023-09-14 DIAGNOSIS — G909 Disorder of the autonomic nervous system, unspecified: Secondary | ICD-10-CM

## 2023-09-14 DIAGNOSIS — G4733 Obstructive sleep apnea (adult) (pediatric): Secondary | ICD-10-CM

## 2023-09-14 DIAGNOSIS — I471 SVT (supraventricular tachycardia) (HCC): Secondary | ICD-10-CM

## 2023-09-14 DIAGNOSIS — Z136 Encounter for screening for cardiovascular disorders: Secondary | ICD-10-CM

## 2023-09-14 DIAGNOSIS — I480 Paroxysmal atrial fibrillation: Secondary | ICD-10-CM

## 2023-09-14 MED ORDER — FLECAINIDE 50 MG PO TAB
75 mg | ORAL_TABLET | Freq: Two times a day (BID) | ORAL | 3 refills | 30.00000 days | Status: AC
Start: 2023-09-14 — End: ?

## 2023-09-14 NOTE — Assessment & Plan Note
RF ablation in 1998 (LDB).

## 2023-09-14 NOTE — Assessment & Plan Note
CPAP was initiated in 2018; managed by Dr. Lucilla Lame.

## 2023-09-14 NOTE — Assessment & Plan Note
Occasional episodes of postural light headedness, exacerbated a couple of years ago by COVID infection.

## 2023-09-14 NOTE — Progress Notes
Date of Service: 09/14/2023    Isabel Cervantes is a 53 y.o. female.       HPI     Isabel Cervantes was in the [place] clinic today for follow-up regarding paroxysmal atrial fibrillation.     When I saw her a year ago she'd had a burst of atrial fibrillation that had taken her to the ED.  We checked a flecainide level and it was slightly low so we increased her dose to 75 mg BID.  Since then she hasn't really had any arrhythmia.  She had the flu a couple of weeks ago and had some very brief bursts of tachycardia while febrile, but otherwise she's been fine.     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         Vitals:    09/14/23 1523   BP: 128/83   BP Source: Arm, Left Upper   Pulse: 64   SpO2: 98%   O2 Device: None (Room air)   PainSc: Zero   Weight: 103.1 kg (227 lb 3.2 oz)   Height: 167.6 cm (5' 6)     Body mass index is 36.67 kg/m?Marland Kitchen     Past Medical History  Patient Active Problem List    Diagnosis Date Noted    OSA (obstructive sleep apnea) 09/17/2015     2017 - Mildly abnormal sleep study--didn't quite qualify for device.  Considering repeat study.  2018 - CPAP started with excellent clinical benefit      Dyspnea 03/03/2011    Autonomic dysfunction 05/14/2010    Chest pain at rest 08/29/2009    Light-headedness 01/17/2009    Observation for suspected cardiovascular disease 01/16/2009     Cardiac Surveillance            1998 - Structurally normal heart by stress echo.            12/01 - ECOD: negative for ischemia, normal LV size and function.  PAP 36.       Atrial fibrillation (HCC) 01/16/2009     Atrial Fibrillation             2001 - Hospitalized at [facility]. Diltiazem initiated. Flecainide as outpatient since then at 50 mg PO BID.  09/03/16 Monitor: Normal 30-day event monitor.  The patient's symptoms do not correlate with significant dysrhythmia.      SVT (supraventricular tachycardia) (HCC) 01/16/2009     HIstory of SVT with RFA--she has infrequent recurrences of limited duration (<1 minute)  Atrial tachycardia and flutter             1998 - Radiofrequency ablation by LDB             8/98 - Right atrial/crista tachycardia identified and ablated with significant improvement                       in palpitations.              8/98 - Moderate generalized atrial abnormalities. On Isuprel, she had nonsustained,                       atypical atrial flutter.         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         ROS    Physical Exam    Physical Exam   General Appearance: no distress   Skin: warm, no ulcers  or xanthomas   Digits and Nails: no cyanosis or clubbing   Eyes: conjunctivae and lids normal, pupils are equal and round   Teeth/Gums/Palate: dentition unremarkable, no lesions   Lips & Oral Mucosa: no pallor or cyanosis   Neck Veins: normal JVP , neck veins are not distended   Thyroid: no nodules, masses, tenderness or enlargement   Chest Inspection: chest is normal in appearance   Respiratory Effort: breathing comfortably, no respiratory distress   Auscultation/Percussion: lungs clear to auscultation, no rales or rhonchi, no wheezing   PMI: PMI not enlarged or displaced   Cardiac Rhythm: regular rhythm and normal rate   Cardiac Auscultation: S1, S2 normal, no rub, no gallop   Murmurs: no murmur   Peripheral Circulation: normal peripheral circulation   Carotid Arteries: normal carotid upstroke bilaterally, no bruits   Radial Arteries: normal symmetric radial pulses   Abdominal Aorta: no abdominal aortic bruit   Pedal Pulses: normal symmetric pedal pulses   Lower Extremity Edema: no lower extremity edema   Abdominal Exam: soft, non-tender, no masses, bowel sounds normal   Liver & Spleen: no organomegaly   Gait & Station: walks without assistance   Muscle Strength: normal muscle tone   Orientation: oriented to time, place and person   Affect & Mood: appropriate and sustained affect   Language and Memory: patient responsive and seems to comprehend information Neurologic Exam: neurological assessment grossly intact   Other: moves all extremities    EKG:  SR, rate 65.      Cardiovascular Health Factors  Vitals BP Readings from Last 3 Encounters:   09/14/23 128/83   09/08/22 110/70   03/28/21 110/74     Wt Readings from Last 3 Encounters:   09/14/23 103.1 kg (227 lb 3.2 oz)   09/08/22 104.1 kg (229 lb 6.4 oz)   03/28/21 96.2 kg (212 lb)     BMI Readings from Last 3 Encounters:   09/14/23 36.67 kg/m?   09/08/22 37.03 kg/m?   03/28/21 34.22 kg/m?      Smoking Social History     Tobacco Use   Smoking Status Never   Smokeless Tobacco Never      Lipid Profile Cholesterol   Date Value Ref Range Status   08/20/2023 189  Final     HDL   Date Value Ref Range Status   08/20/2023 58  Final     LDL   Date Value Ref Range Status   08/20/2023 114 (H) <100 Final     Triglycerides   Date Value Ref Range Status   08/20/2023 87  Final      Blood Sugar No results found for: HGBA1C  Glucose   Date Value Ref Range Status   08/20/2023 97  Final   08/23/2022 98  Final   01/18/2019 99  Final          Problems Addressed Today  Encounter Diagnoses   Name Primary?    Paroxysmal atrial fibrillation (HCC) Yes    SVT (supraventricular tachycardia) (HCC)     OSA (obstructive sleep apnea)     Autonomic dysfunction     Screening for heart disease        Assessment and Plan       Atrial fibrillation (HCC)  Initial diagnosis in 2001; she's been on flecainide 50 mg BID since then.  She may have had an episode of AF in early 2024, but her rhythm was normal at ED presentation on 08/23/22.    We  checked a trough flecainide level in 09/2022 and it came back at 0.19, therapeutic range being 0.20-0.99.  We increased her flecainide to 75 mg BID and she seems to be doing OK at that dose.    Stress echo in 2022 was normal.  EF 65%.  Atrial sizes normal.    SVT (supraventricular tachycardia) (HCC)  RF ablation in 1998 (LDB).    OSA (obstructive sleep apnea)  CPAP was initiated in 2018; managed by Dr. Lucilla Lame.    Autonomic dysfunction  Occasional episodes of postural light headedness, exacerbated a couple of years ago by COVID infection.    Current Medications (including today's revisions)   ferrous bis-glycinate chelate (IRON BISGLYCINATE CHELATE PO) 25 mg.    flecainide (TAMBOCOR) 50 mg tablet Take 1.5 tablets by mouth twice daily.    L.acid/L.casei/B.bif/B.lon/FOS (PROBIOTIC BLEND PO) Take 1 tablet by mouth daily.    MULTI-VITAMIN PO Take  by mouth daily.     Total time spent on today's office visit was 30 minutes.  This includes face-to-face in person visit with patient as well as nonface-to-face time including review of the EMR, outside records, labs, radiologic studies, echocardiogram & other cardiovascular studies, formation of treatment plan, after visit summary, future disposition, and lastly on documentation.

## 2023-09-14 NOTE — Patient Instructions
Check about a mandibular advancement device for sleep apnea

## 2024-03-02 ENCOUNTER — Encounter: Admit: 2024-03-02 | Discharge: 2024-03-02 | Payer: BLUE CROSS/BLUE SHIELD

## 2024-07-13 ENCOUNTER — Encounter: Admit: 2024-07-13 | Discharge: 2024-07-13 | Payer: BLUE CROSS/BLUE SHIELD

## 2024-08-11 ENCOUNTER — Encounter: Admit: 2024-08-11 | Discharge: 2024-08-11 | Payer: BLUE CROSS/BLUE SHIELD

## 2024-09-07 ENCOUNTER — Encounter: Admit: 2024-09-07 | Discharge: 2024-09-07 | Payer: BLUE CROSS/BLUE SHIELD
